# Patient Record
Sex: Female | Born: 1995 | Race: Black or African American | Hispanic: No | Marital: Single | Smoking: Never smoker
Health system: Southern US, Community
[De-identification: ages and names within clinical notes are randomized; demographics above are authoritative.]

---

## 2016-09-19 ENCOUNTER — Emergency Department (HOSPITAL_COMMUNITY): Payer: BLUE CROSS/BLUE SHIELD

## 2016-09-19 ENCOUNTER — Encounter (HOSPITAL_COMMUNITY): Payer: Self-pay | Admitting: Emergency Medicine

## 2016-09-19 ENCOUNTER — Emergency Department (HOSPITAL_COMMUNITY)
Admission: EM | Admit: 2016-09-19 | Discharge: 2016-09-19 | Disposition: A | Discharge status: Home / Self Care | Payer: BLUE CROSS/BLUE SHIELD | Attending: Emergency Medicine | Admitting: Emergency Medicine

## 2016-09-19 DIAGNOSIS — Y9301 Activity, walking, marching and hiking: Secondary | ICD-10-CM | POA: Insufficient documentation

## 2016-09-19 DIAGNOSIS — R51 Headache: Secondary | ICD-10-CM | POA: Diagnosis not present

## 2016-09-19 DIAGNOSIS — S80212A Abrasion, left knee, initial encounter: Secondary | ICD-10-CM | POA: Insufficient documentation

## 2016-09-19 DIAGNOSIS — Y999 Unspecified external cause status: Secondary | ICD-10-CM | POA: Insufficient documentation

## 2016-09-19 DIAGNOSIS — S60512A Abrasion of left hand, initial encounter: Secondary | ICD-10-CM | POA: Insufficient documentation

## 2016-09-19 DIAGNOSIS — S80211A Abrasion, right knee, initial encounter: Secondary | ICD-10-CM | POA: Diagnosis not present

## 2016-09-19 DIAGNOSIS — Y92410 Unspecified street and highway as the place of occurrence of the external cause: Secondary | ICD-10-CM | POA: Diagnosis not present

## 2016-09-19 DIAGNOSIS — S0081XA Abrasion of other part of head, initial encounter: Secondary | ICD-10-CM | POA: Insufficient documentation

## 2016-09-19 DIAGNOSIS — S6992XA Unspecified injury of left wrist, hand and finger(s), initial encounter: Secondary | ICD-10-CM | POA: Diagnosis present

## 2016-09-19 LAB — CBC WITH DIFFERENTIAL/PLATELET
BASOS ABS: 0 10*3/uL (ref 0.0–0.1)
Basophils Relative: 1 %
EOS ABS: 0 10*3/uL (ref 0.0–0.7)
Eosinophils Relative: 1 %
HCT: 41.3 % (ref 36.0–46.0)
Hemoglobin: 14.1 g/dL (ref 12.0–15.0)
Lymphocytes Relative: 62 %
Lymphs Abs: 2.6 10*3/uL (ref 0.7–4.0)
MCH: 31.1 pg (ref 26.0–34.0)
MCHC: 34.1 g/dL (ref 30.0–36.0)
MCV: 91 fL (ref 78.0–100.0)
Monocytes Absolute: 0.5 10*3/uL (ref 0.1–1.0)
Monocytes Relative: 13 %
Neutro Abs: 1 10*3/uL — ABNORMAL LOW (ref 1.7–7.7)
Neutrophils Relative %: 24 %
PLATELETS: 228 10*3/uL (ref 150–400)
RBC: 4.54 MIL/uL (ref 3.87–5.11)
RDW: 12.8 % (ref 11.5–15.5)
WBC: 4.2 10*3/uL (ref 4.0–10.5)

## 2016-09-19 LAB — COMPREHENSIVE METABOLIC PANEL
ALT: 52 U/L (ref 14–54)
AST: 44 U/L — AB (ref 15–41)
Albumin: 3.6 g/dL (ref 3.5–5.0)
Alkaline Phosphatase: 57 U/L (ref 38–126)
Anion gap: 8 (ref 5–15)
BUN: 9 mg/dL (ref 6–20)
CHLORIDE: 104 mmol/L (ref 101–111)
CO2: 25 mmol/L (ref 22–32)
CREATININE: 0.95 mg/dL (ref 0.44–1.00)
Calcium: 9.5 mg/dL (ref 8.9–10.3)
GFR calc non Af Amer: >60 mL/min (ref >60)
Glucose, Bld: 104 mg/dL — ABNORMAL HIGH (ref 65–99)
Potassium: 3.9 mmol/L (ref 3.5–5.1)
SODIUM: 137 mmol/L (ref 135–145)
Total Bilirubin: 0.5 mg/dL (ref 0.3–1.2)
Total Protein: 7.1 g/dL (ref 6.5–8.1)

## 2016-09-19 LAB — I-STAT BETA HCG BLOOD, ED (MC, WL, AP ONLY)

## 2016-09-19 NOTE — ED Notes (Signed)
Pt to radiology.
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
## 2016-09-19 NOTE — ED Notes (Signed)
Pt notified of need for urine specimen. 

## 2016-10-07 ENCOUNTER — Emergency Department (HOSPITAL_COMMUNITY)
Admission: EM | Admit: 2016-10-07 | Discharge: 2016-10-07 | Disposition: A | Discharge status: Home / Self Care | Payer: BLUE CROSS/BLUE SHIELD | Attending: Emergency Medicine | Admitting: Emergency Medicine

## 2016-10-07 ENCOUNTER — Encounter (HOSPITAL_COMMUNITY): Payer: Self-pay | Admitting: Emergency Medicine

## 2016-10-07 DIAGNOSIS — L819 Disorder of pigmentation, unspecified: Secondary | ICD-10-CM | POA: Diagnosis not present

## 2016-10-07 NOTE — Discharge Instructions (Signed)
Monitor for skin changes, worsening discoloration, onset of/or increasing pain.  Follow-up with your primary care provider as discussed.
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
